# Patient Record
Sex: Male | Born: 1985 | Race: White | Hispanic: No | Marital: Single | Smoking: Never smoker
Health system: Southern US, Community
[De-identification: ages and names within clinical notes are randomized; demographics above are authoritative.]

## PROBLEM LIST (undated history)

## (undated) DIAGNOSIS — N200 Calculus of kidney: Secondary | ICD-10-CM

## (undated) DIAGNOSIS — F329 Major depressive disorder, single episode, unspecified: Secondary | ICD-10-CM

## (undated) DIAGNOSIS — F32A Depression, unspecified: Secondary | ICD-10-CM

## (undated) HISTORY — PX: CHOLECYSTECTOMY: SHX55

---

## 2013-02-16 ENCOUNTER — Emergency Department (HOSPITAL_COMMUNITY)
Admission: EM | Admit: 2013-02-16 | Discharge: 2013-02-17 | Disposition: A | Discharge status: Home / Self Care | Payer: BLUE CROSS/BLUE SHIELD | Attending: Emergency Medicine | Admitting: Emergency Medicine

## 2013-02-16 ENCOUNTER — Encounter (HOSPITAL_COMMUNITY): Payer: Self-pay | Admitting: Emergency Medicine

## 2013-02-16 ENCOUNTER — Emergency Department (HOSPITAL_COMMUNITY): Payer: BLUE CROSS/BLUE SHIELD

## 2013-02-16 DIAGNOSIS — X500XXA Overexertion from strenuous movement or load, initial encounter: Secondary | ICD-10-CM | POA: Diagnosis not present

## 2013-02-16 DIAGNOSIS — S99929A Unspecified injury of unspecified foot, initial encounter: Secondary | ICD-10-CM | POA: Diagnosis not present

## 2013-02-16 DIAGNOSIS — Z87442 Personal history of urinary calculi: Secondary | ICD-10-CM | POA: Diagnosis not present

## 2013-02-16 DIAGNOSIS — S99919A Unspecified injury of unspecified ankle, initial encounter: Principal | ICD-10-CM

## 2013-02-16 DIAGNOSIS — Y9289 Other specified places as the place of occurrence of the external cause: Secondary | ICD-10-CM | POA: Insufficient documentation

## 2013-02-16 DIAGNOSIS — S6992XA Unspecified injury of left wrist, hand and finger(s), initial encounter: Secondary | ICD-10-CM

## 2013-02-16 DIAGNOSIS — Z8659 Personal history of other mental and behavioral disorders: Secondary | ICD-10-CM | POA: Diagnosis not present

## 2013-02-16 DIAGNOSIS — Y990 Civilian activity done for income or pay: Secondary | ICD-10-CM | POA: Diagnosis not present

## 2013-02-16 DIAGNOSIS — S8990XA Unspecified injury of unspecified lower leg, initial encounter: Secondary | ICD-10-CM | POA: Diagnosis present

## 2013-02-16 DIAGNOSIS — Y9389 Activity, other specified: Secondary | ICD-10-CM | POA: Diagnosis not present

## 2013-02-16 DIAGNOSIS — X503XXA Overexertion from repetitive movements, initial encounter: Secondary | ICD-10-CM | POA: Diagnosis not present

## 2013-02-16 HISTORY — DX: Major depressive disorder, single episode, unspecified: F32.9

## 2013-02-16 HISTORY — DX: Calculus of kidney: N20.0

## 2013-02-16 HISTORY — DX: Depression, unspecified: F32.A

## 2013-02-16 NOTE — ED Notes (Signed)
Pt. 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

## 2013-02-17 MED ORDER — IBUPROFEN 800 MG PO TABS: 800.0000 mg | ORAL_TABLET | Freq: Three times a day (TID) | ORAL | Status: AC

## 2013-02-17 NOTE — ED Provider Notes (Signed)
Medical screening examination/treatment/procedure(s) were performed by non-physician practitioner and as supervising physician I was immediately available for consultation/collaboration.    Mishel Sans, MD 02/17/13 0740 

## 2013-02-17 NOTE — Discharge Instructions (Signed)
Take ibuprofen as needed for pain. Apply ice, rest, and elevate your affected wrist. Move the wrist as tolerated. Refer to attached documents for more information.

## 2013-02-17 NOTE — ED Provider Notes (Signed)
CSN: [id number]     Arrival date & time 02/16/13  2307 History   First MD Initiated Contact with Patient 02/16/13 2316     Chief Complaint  Patient presents with  . Wrist Pain     (Consider location/radiation/quality/duration/timing/severity/associated sxs/prior Treatment) Patient is a 28 y.o. male presenting with wrist pain. The history is provided by the patient. No language interpreter was used.  Wrist Pain This is a new problem. The current episode started today. The problem occurs rarely. The problem has been unchanged. Associated symptoms include arthralgias. Pertinent negatives include no abdominal pain, chest pain, chills, fatigue, fever, nausea, neck pain, vomiting or weakness. Exacerbated by: movement. He has tried ice for the symptoms. The treatment provided mild relief.    Past Medical History  Diagnosis Date  . Depression   . Kidney calculi    Past Surgical History  Procedure Laterality Date  . Cholecystectomy     History reviewed. No pertinent family history. History  Substance Use Topics  . Smoking status: Never Smoker   . Smokeless tobacco: Not on file  . Alcohol Use: No    Review of Systems  Constitutional: Negative for fever, chills and fatigue.  HENT: Negative for trouble swallowing.   Eyes: Negative for visual disturbance.  Respiratory: Negative for shortness of breath.   Cardiovascular: Negative for chest pain and palpitations.  Gastrointestinal: Negative for nausea, vomiting, abdominal pain and diarrhea.  Genitourinary: Negative for dysuria and difficulty urinating.  Musculoskeletal: Positive for arthralgias. Negative for neck pain.  Skin: Negative for color change.  Neurological: Negative for dizziness and weakness.  Psychiatric/Behavioral: Negative for dysphoric mood.      Allergies  Review of patient's allergies indicates not on file.  Home Medications  No current outpatient prescriptions on file. BP 152/92  Pulse 104  Temp(Src) 98.6 F  (37 C) (Oral)  Resp 18  Ht 5\' 9"  (1.753 m)  Wt 187 lb (84.823 kg)  BMI 27.60 kg/m2  SpO2 99% Physical Exam  Nursing note and vitals reviewed. Constitutional: He is oriented to person, place, and time. He appears well-developed and well-nourished. No distress.  HENT:  Head: Normocephalic and atraumatic.  Eyes: Conjunctivae are normal.  Neck: Normal range of motion.  Cardiovascular: Normal rate and regular rhythm.  Exam reveals no gallop and no friction rub.   No murmur heard. Pulmonary/Chest: Effort normal and breath sounds normal. He has no wheezes. He has no rales. He exhibits no tenderness.  Musculoskeletal:  Left wrist ROM limited due to pain. No obvious deformity. No edema. Full ROM of fingers of left hand.   Neurological: He is alert and oriented to person, place, and time. Coordination normal.  Speech is goal-oriented. Moves limbs without ataxia.   Skin: Skin is warm and dry.  Psychiatric: He has a normal mood and affect. His behavior is normal.    ED Course  Procedures (including critical care time) Labs Review Labs Reviewed - No data to display Imaging Review Dg Wrist Complete Left  02/16/2013   CLINICAL DATA:  Left wrist pain  EXAM: LEFT WRIST - COMPLETE 3+ VIEW  COMPARISON:  None.  FINDINGS: There is no evidence of fracture or dislocation. There is no evidence of arthropathy or other focal bone abnormality. Soft tissues are unremarkable.  IMPRESSION: Negative.   Electronically Signed   By: Sherian Rein M.D.   On: 02/16/2013 23:47    EKG Interpretation   None       MDM   Final diagnoses:  None  1. Left wrist injury   12:24 AM Patient's xray unremarkable. No neurovascular compromise. Patient denies any other injury. Patient advised to apply ice and move as tolerated. Patient will have ibuprofen for pain.     Emilia BeckKaitlyn Norene Oliveri, [place] JerseyPA-C 02/17/13 [phone]

## 2015-05-05 IMAGING — CR DG WRIST COMPLETE 3+V*L*
4 series · 4 of 4 positions shown · non-contrast
Comparison: None.

CLINICAL DATA: Left wrist pain

EXAM:
LEFT WRIST - COMPLETE 3+ VIEW

[x wrist pa left]
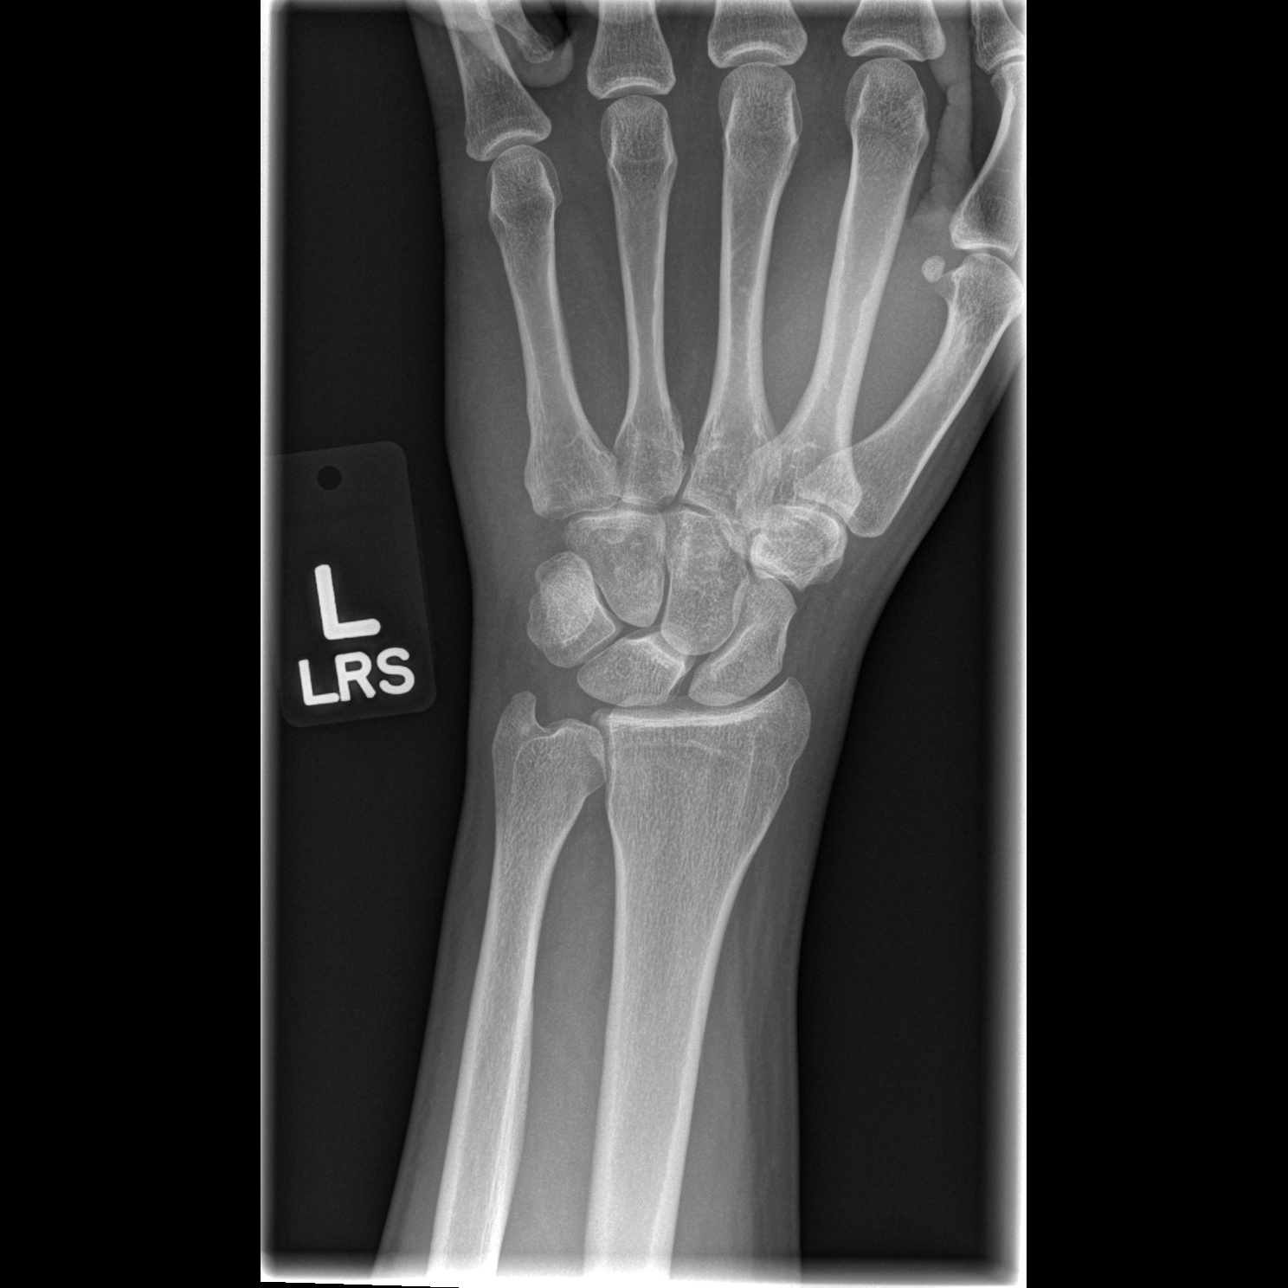

[x wrist obl left]
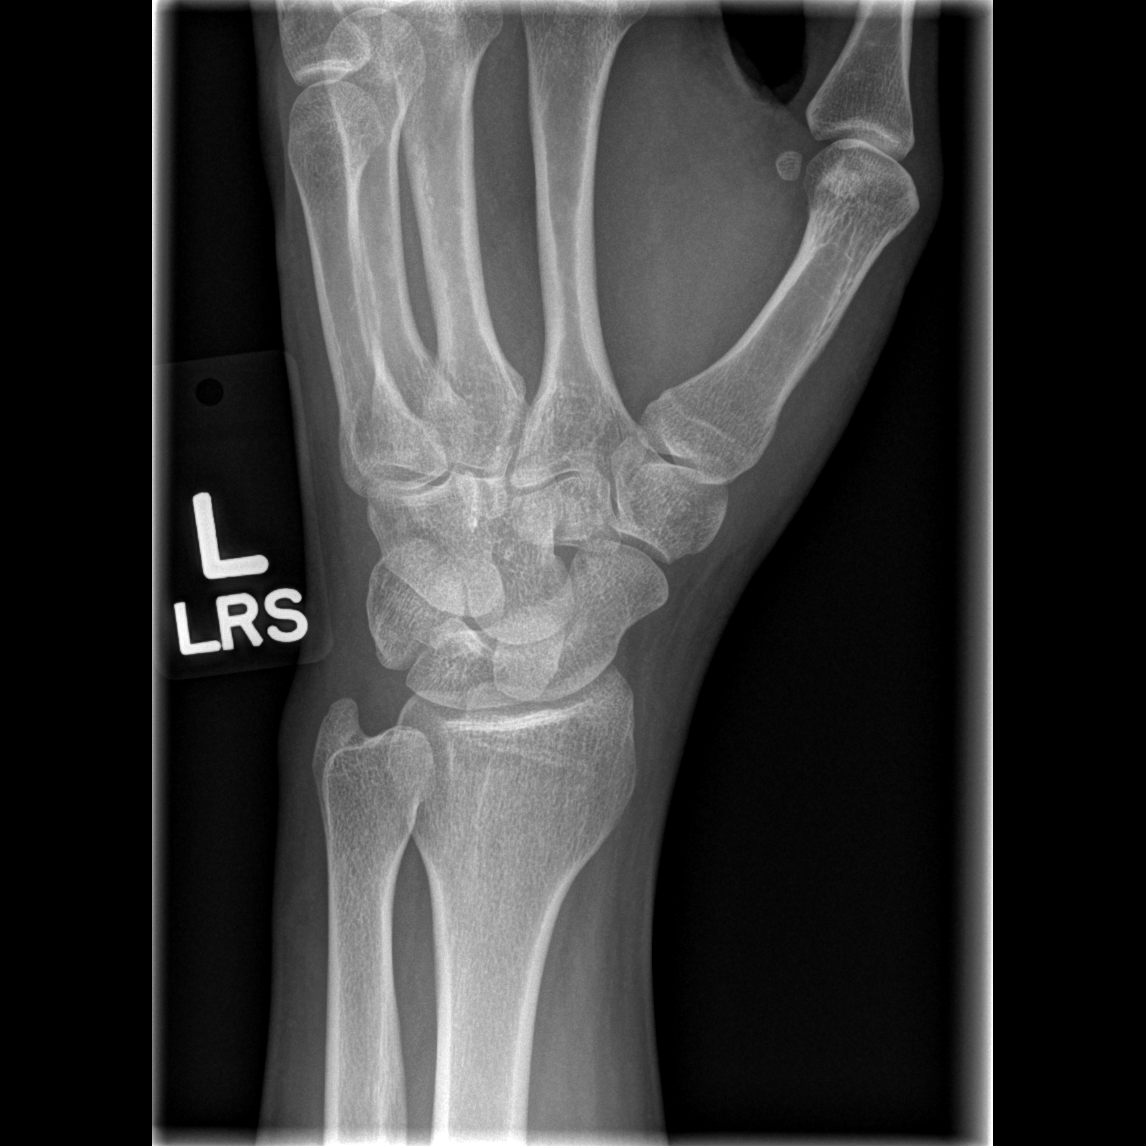

[x wrist lat left]
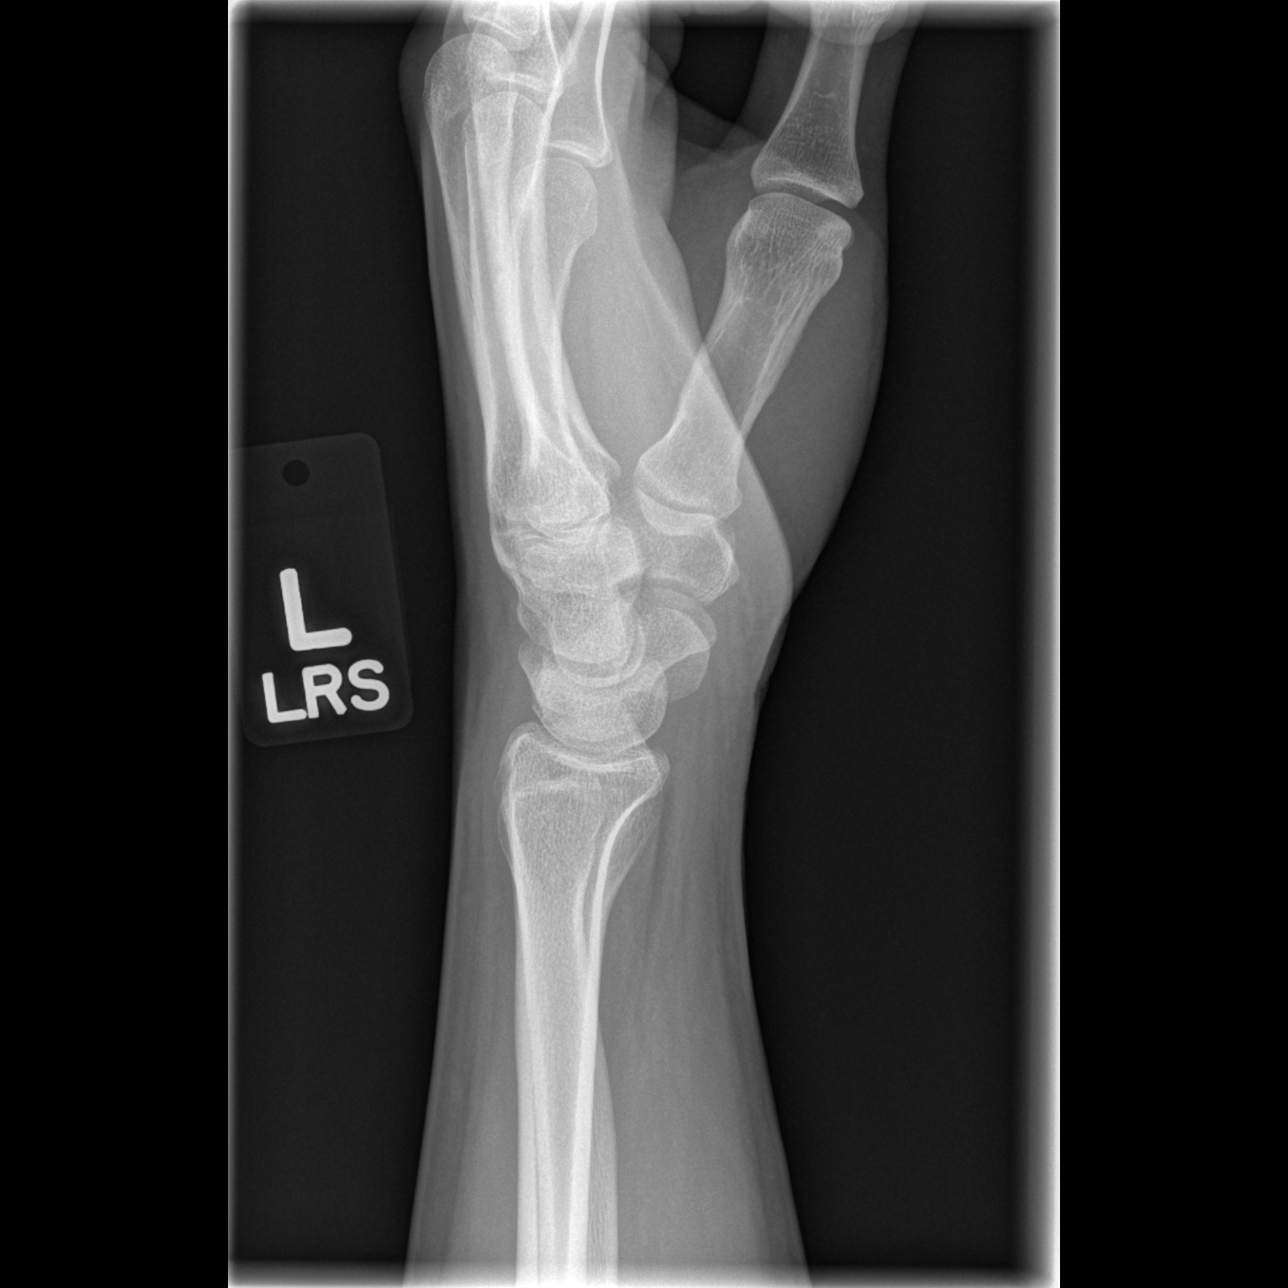

[x navicular]
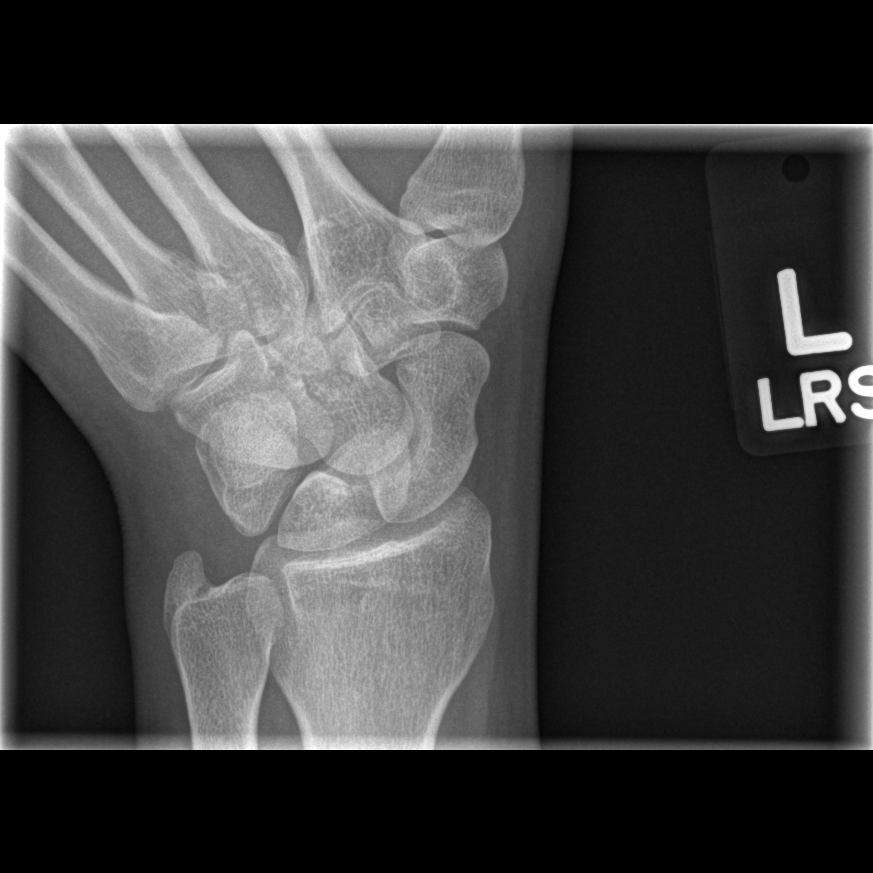

[4 of 4 positions shown; findings below may reference images not displayed]

FINDINGS: There is no evidence of fracture or dislocation. There is no
evidence of arthropathy or other focal bone abnormality. Soft
tissues are unremarkable.
IMPRESSION: Negative.

## 2021-07-10 NOTE — Progress Notes (Signed)
Sleep Medicine   Office Visit  Patient Name: Alex Bradford DOB: 1985-09-03 MRN [id number]    Chief Complaint: Sleep consult  Brief History:  Almin presents for an initial consult for sleep evaluation and to establish care. Patient reports his sleep quality is poor due to waking tired daily. He reports this has been going on for 17 years or so. This is noted most nights. The patient's family reports  loud snoring at night. The patient relates the following symptoms: waking tired, sometimes wakes with a headache, brain fog and lack of focus, daytime sleepiness are also present. The patient goes to sleep at 12:00am and wakes up at 6:00am.  Patient delay sleep onset frequently and can go to bed as late as 5:00am . Patient reports taking daily naps that are 1-3 hours long. Sleep quality is the same  when outside home environment.  Patient has noted some restlessness of his legs, but it does not disrupt his sleep.  The patient  relates no unusual behavior during the night.  The patient relates anxiety and depression as a history of psychiatric problems. The Epworth Sleepiness Score is 5 out of 24 .  The patient relates  Cardiovascular risk factors include: His primary care is watching his blood pressure, it tends to run high under stressful situations.  The patient reports that he just recently obtained a new bed which has helped his sleep. Patient admits his sleep hygiene is "off". Dad has OSA.   ROS  General: (-) fever, (-) chills, (-) night sweat Nose and Sinuses: (-) nasal stuffiness or itchiness, (-) postnasal drip, (-) nosebleeds, (-) sinus trouble. Mouth and Throat: (-) sore throat, (-) hoarseness. Neck: (-) swollen glands, (-) enlarged thyroid, (-) neck pain. Respiratory: - cough, - shortness of breath, - wheezing. Neurologic: - numbness, - tingling. Psychiatric: + anxiety, - depression Sleep behavior: -sleep paralysis -hypnogogic hallucinations -dream enactment      -vivid dreams -cataplexy  -night terrors -sleep walking   Current Medication: Outpatient Encounter Medications as of 07/12/2021  Medication Sig   albuterol (VENTOLIN HFA) 108 (90 Base) MCG/ACT inhaler PLEASE SEE ATTACHED FOR DETAILED DIRECTIONS   desloratadine (CLARINEX) 5 MG tablet Take 1 tablet by mouth daily.   fluticasone (FLONASE) 50 MCG/ACT nasal spray    montelukast (SINGULAIR) 10 MG tablet TAKE 1 TABLET BY MOUTH EVERY DAY AT NIGHT   fluticasone-salmeterol (ADVAIR HFA) 115-21 MCG/ACT inhaler Inhale 2 puffs into the lungs 2 (two) times daily.   ibuprofen (ADVIL,MOTRIN) 800 MG tablet Take 1 tablet (800 mg total) by mouth 3 (three) times daily.   No facility-administered encounter medications on file as of 07/12/2021.    Surgical History: Past Surgical History:  Procedure Laterality Date   CHOLECYSTECTOMY      Medical History: Past Medical History:  Diagnosis Date   Depression    Kidney calculi     Family History: Non contributory to the present illness  Social History: Social History   Socioeconomic History   Marital status: Single    Spouse name: Not on file   Number of children: Not on file   Years of education: Not on file   Highest education level: Not on file  Occupational History   Not on file  Tobacco Use   Smoking status: Never   Smokeless tobacco: Not on file  Substance and Sexual Activity   Alcohol use: No   Drug use: No   Sexual activity: Not on file  Other Topics Concern   Not on file  Social History Narrative   Not on file   Social Determinants of Health   Financial Resource Strain: Not on file  Food Insecurity: Not on file  Transportation Needs: Not on file  Physical Activity: Not on file  Stress: Not on file  Social Connections: Not on file  Intimate Partner Violence: Not on file    Vital Signs: Blood pressure (!) 145/103, pulse 84, height 5' 9.5" (1.765 m), weight 205 lb 3.2 oz (93.1 kg), SpO2 99 %. Body mass index is 29.87 kg/m.   Examination: General  Appearance: The patient is well-developed, well-nourished, and in no distress. Neck Circumference: 46cm Skin: Gross inspection of skin unremarkable. Head: normocephalic, no gross deformities. Eyes: no gross deformities noted. ENT: ears appear grossly normal Neurologic: Alert and oriented. No involuntary movements.    EPWORTH SLEEPINESS SCALE:  Scale:  (0)= no chance of dozing; (1)= slight chance of dozing; (2)= moderate chance of dozing; (3)= high chance of dozing  Chance  Situtation    Sitting and reading: 1    Watching TV: 1    Sitting Inactive in public: 0    As a passenger in car: 0      Lying down to rest: 2    Sitting and talking: 0    Sitting quielty after lunch: 1    In a car, stopped in traffic: 0   TOTAL SCORE:   5 out of 24    SLEEP STUDIES:  none   LABS: No results found for this or any previous visit (from the past 2160 hour(s)).  Radiology: DG Wrist Complete Left  Result Date: 02/16/2013 CLINICAL DATA:  Left wrist pain EXAM: LEFT WRIST - COMPLETE 3+ VIEW COMPARISON:  None. FINDINGS: There is no evidence of fracture or dislocation. There is no evidence of arthropathy or other focal bone abnormality. Soft tissues are unremarkable. IMPRESSION: Negative. Electronically Signed   By: Sherian Rein M.D.   On: 02/16/2013 23:47    No results found.  No results found.    Assessment and Plan: Patient Active Problem List   Diagnosis Date Noted   GERD (gastroesophageal reflux disease) 07/12/2021   Mild intermittent asthma without complication 07/12/2021     PLAN OSA:   Patient evaluation suggests high risk of sleep disordered breathing due to loud snoring, waking tired, sometimes wakes with a headache, brain fog and lack of focus, daytime sleepiness, and Fhx OSA. Patient has comorbid cardiovascular risk factors including: Elevated BP in office which could be exacerbated by pathologic sleep-disordered breathing.  Suggest: HST to assess/treat the  patient's sleep disordered breathing.    1. Hypersomnia Will order HST  2. Mild intermittent asthma without complication Continue inhalers as prescribed  3. Elevated BP without diagnosis of hypertension Elevated in office, per pt his PCP is monitoring this   General Counseling: I have discussed the findings of the evaluation and examination with Casmer.  I have also discussed any further diagnostic evaluation thatmay be needed or ordered today. Robertt verbalizes understanding of the findings of todays visit. We also reviewed his medications today and discussed drug interactions and side effects including but not limited excessive drowsiness and altered mental states. We also discussed that there is always a risk not just to him but also people around him. he has been encouraged to call the office with any questions or concerns that should arise related to todays visit.  No orders of the defined types were placed in this encounter.       I  have personally obtained a history, evaluated the patient, evaluated pertinent data, formulated the assessment and plan and placed orders.  This patient was seen by Lynn Ito, PA-C in collaboration with Dr. Freda Munro as a part of collaborative care agreement.    Yevonne Pax, MD [facility] Diplomate ABMS Pulmonary and Critical Care Medicine Sleep medicine

## 2021-07-12 ENCOUNTER — Ambulatory Visit (INDEPENDENT_AMBULATORY_CARE_PROVIDER_SITE_OTHER): Payer: BLUE CROSS/BLUE SHIELD | Admitting: Internal Medicine

## 2021-07-12 VITALS — BP 145/103 | HR 84 | Ht 69.5 in | Wt 205.2 lb

## 2021-07-12 DIAGNOSIS — G4710 Hypersomnia, unspecified: Secondary | ICD-10-CM | POA: Diagnosis not present

## 2021-07-12 DIAGNOSIS — J4520 Mild intermittent asthma, uncomplicated: Secondary | ICD-10-CM | POA: Diagnosis not present

## 2021-07-12 DIAGNOSIS — R030 Elevated blood-pressure reading, without diagnosis of hypertension: Secondary | ICD-10-CM | POA: Diagnosis not present

## 2021-07-12 DIAGNOSIS — K219 Gastro-esophageal reflux disease without esophagitis: Secondary | ICD-10-CM | POA: Insufficient documentation

## 2021-07-12 NOTE — Patient Instructions (Signed)
Hypersomnia Hypersomnia is a condition in which a person feels very tired during the day even though the person gets plenty of sleep at night. A person with this condition may take naps during the day and may find it very difficult to wake up from sleep. Hypersomnia may affect a person's ability to think, concentrate, drive, or remember things. What are the causes? The cause of this condition may not be known. Possible causes include: Taking certain medicines. Using drugs or alcohol. Sleep disorders, such as narcolepsy and sleep apnea. Injury to the head, brain, or spinal cord. Tumors. Certain medical conditions. These include: Depression. Diabetes. Gastroesophageal reflux disease (GERD). An underactive thyroid gland (hypothyroidism). What are the signs or symptoms? The main symptoms of hypersomnia include: Feeling very tired throughout the day, regardless of how much sleep you got the night before. Having trouble waking up. Others may find it difficult to wake you up when you are sleeping. Sleeping for longer and longer periods at a time. Taking naps throughout the day. Other symptoms may include: Feeling restless, anxious, or annoyed. Lacking energy. Having trouble with: Remembering. Speaking. Thinking. Loss of appetite. Seeing, hearing, tasting, smelling, or feeling things that are not real (hallucinations). How is this diagnosed? This condition may be diagnosed based on: Your symptoms and medical history. Your sleeping habits. Your health care provider may ask you to write down your sleeping habits in a daily sleep log, along with any symptoms you have. A series of tests that are done while you sleep (sleep study or polysomnogram). A test that measures how quickly you can fall asleep during the day (daytime nap study or multiple sleep latency test). How is this treated? This condition may be treated by: Following a regular sleep routine. Making lifestyle changes, such as  changing your eating habits, getting regular exercise, and avoiding alcohol or caffeinated beverages. Taking medicines to make you more alert (stimulants) during the day. Treating any underlying medical causes of hypersomnia. Follow these instructions at home: Sleep habits Stick to a routine that includes going to bed and waking up at the same times every day and night. Practice a relaxing bedtime routine. This may include reading, meditation, deep breathing, or taking a warm bath before going to sleep. Exercise regularly as told by your health care provider. However, avoid exercising in the hours right before bedtime. Keep your sleep environment at a cooler temperature, darkened, and quiet. Sleep with pillows and a mattress that are comfortable and supportive. Schedule short 20-minute naps for when you feel sleepiest during the day. Talk with your employer or teachers about your hypersomnia. If possible, adjust your schedule so that: You have a regular daytime work schedule. You can take a scheduled nap during the day. You do not have to work or be active at night. Do not eat a heavy meal for a few hours before bedtime. Eat your meals at about the same times every day. Safety  Do not drive or use machinery if you are sleepy. Ask your health care provider if it is safe for you to drive. Wear a life jacket when swimming or spending time near water. General instructions  Take over-the-counter and prescription medicines only as told by your health care provider. This includes supplements. Avoid drinking alcohol or caffeinated beverages. Keep a sleep log that will help your health care provider manage your condition. This may include information about: What time you go to bed each night. How often you wake up at night. How many hours   you sleep at night. How often and for how long you nap during the day. Any observations from others, such as leg movements during sleep, sleep walking, or  snoring. Keep all follow-up visits. This is important. Contact a health care provider if: You have new symptoms. Your symptoms get worse. Get help right away if: You have thoughts about hurting yourself or someone else. Get help right away if you feel like you may hurt yourself or others, or have thoughts about taking your own life. Go to your nearest emergency room or: Call 911. Call the National Suicide Prevention Lifeline at 1-800-273-8255 or 988. This is open 24 hours a day. Text the Crisis Text Line at 741741. Summary Hypersomnia refers to a condition in which you feel very tired during the day even though you get plenty of sleep at night. A person with this condition may take naps during the day and may find it very difficult to wake up from sleep. Hypersomnia may affect a person's ability to think, concentrate, drive, or remember things. Treatment may include a regular sleep routine and making some lifestyle changes. This information is not intended to replace advice given to you by your health care provider. Make sure you discuss any questions you have with your health care provider. Document Revised: 12/05/2020 Document Reviewed: 12/05/2020 Elsevier Patient Education  2023 Elsevier Inc.  

## 2021-07-21 ENCOUNTER — Encounter (INDEPENDENT_AMBULATORY_CARE_PROVIDER_SITE_OTHER): Payer: BLUE CROSS/BLUE SHIELD | Admitting: Internal Medicine

## 2021-07-21 DIAGNOSIS — G4719 Other hypersomnia: Secondary | ICD-10-CM | POA: Diagnosis not present

## 2021-08-01 NOTE — Procedures (Signed)
[facility]  Portable Polysomnogram Report Part 1 Phone: [phone] Fax: [phone]  Patient Name: Alex Bradford, Alex Bradford Recording Device: Tawana Scale  D.O.B.: 1985-09-05 Acquisition Number: [id number]  Referring Physician: Deeann Saint  Acquisition Date: 07/21/2021   History: The patient is a 36 years old male who was referred for evaluation of possible sleep apnea.  Medical History: depression.  Medications: Ventolin, Clarenex, Flonase, Singulair, Advair.  PROCEDURE  The unattended portable polysomnogram was conducted on the night of 07/21/2021.  The following parameters were monitored: Nasal and oral airflow, and body position. Additionally, thoracic and abdominal movements were recorded by inductance plethysmography. Oxygen saturation (SpO2) and heart rate (ECG) was monitored using a pulse Oximeter.  The tracing was scored using 30 second epochs. Hypopneas were scored per AASM definition VIIID1.B (4% desaturation).    Description: The total recording time was 802.3 minutes. Sleep parameters are not recorded.  Respiratory monitoring demonstrated significant snoring across the night in all positions. Most of the recording was in the supine position. There were a total of 976 apneas and hypopneas for a Respiratory Event Index of 75.3 apneas and hypopneas per hour of recording. The average duration of the respiratory events was 19.2 seconds with a maximum duration of 57.0 seconds. The respiratory events were associated with peripheral oxygen desaturations on the average to 95 %. The lowest oxygen desaturation associated with a respiratory event was 79 %. Additionally, the mean oxygen saturation was 95 %. The total duration of oxygen < 90% was 49.9 minutes and <80% was 0.0 minutes.   Cardiac monitoring- The average heart rate during the recording was 78.7 bpm.  Impression: This routine overnight portable polysomnogram demonstrated the presence of obstructive sleep  apnea. Overall the Respiratory Event Index was 75.3 apneas and hypopneas per hour of recording with the lowest desaturation to 79 %. Most of the recording was in the supine position.  Recommendations:     An emergent CPAP titration would be recommended due to the severity of the sleep apnea. Some supine sleep should be ensured to optimize the titration. Would recommend weight loss in a patient with a BMI of 29.8 lb/in2.   Yevonne Pax, MD [facility] Diplomate ABMS Pulmonary Critical Care Medicine Sleep Medicine Electronically reviewed and digitally signed      [facility]  Portable Polysomnogram Report Part 2 Phone: [phone] Fax: [phone]    Study Date: 07/21/2021  Patient Name: [name], [name] Recording Device: Tawana Scale  Sex: M Height: 69.5 in.  D.O.B.: [birth year] Weight: 205.0 lbs.  Age: 37 years B.M.I: 29.8 lb/in2   Times and Durations  Lights off clock time:  7:41:17 PM Total Recording Time (TRT): 802.3 minutes  Lights on clock time: 9:03:35 AM Time In Bed (TIB): 802.3 minutes   Summary  AHI 75.3 OAI 70.5 CAI 0.0 Lowest Desat 79  AHI is the number of apneas and hypopneas per hour. OAI is the number of obstructive apneas per hour. CAI is the number of central apneas per hour. Lowest Desat is the lowest blood oxygen level that lasted at least 2 seconds.  RESPIRATORY EVENTS   Index (#/hour) Total # of Events Mean duration  (sec) Max duration  (sec) # of Events by Position       Supine Prone Left Right Up  Central Apneas 0.0 0 0.0 0.0 0 0 0 0 0  Obstructive Apneas 70.5 914 18.8 57.0 912 0 0 2 0  Mixed Apneas 0.9 12 15.5 23.5 11  0 0 1 0  Hypopneas 3.9 50 26.0 55.5 45 0 0 4 1  Apneas + Hypopneas 75.3 976 19.2 57.0 968 0 0 7 1  Total 75.3 976 19.2 57.0 968 0 0 7 1  Time in Position 743.6 0.1 0.1 34.0 3.3  AHI in Position 78.1 0.0 0.0 12.4 150.0    Oximetry Summary   Dur. (min) % TIB  <90 % 49.9 6.2  <85 % 6.2 0.8  <80 % 0.0 0.0  <70 % 0.0 0.0  Total  Dur (min) < 89 34.4 min  Average (%) 95  Total # of Desats 957  Desat Index (#/hour) 74.3  Desat Max (%) 18  Desat Max dur (sec) 53.0  Lowest SpO2 % during sleep 79  Duration of Min SpO2 (sec) 5    Heart Rate Stats  Mean HR during sleep (BPM)  Highest HR during sleep 108  (BPM)  Highest HR during TIB  108 (BPM)    Snoring Summary  Total Snoring Episodes 1295  Total Duration with Snoring 241.2 minutes  Mean Duration of Snoring 11.2 seconds  Percentage of Snoring 31.0 %

## 2022-05-15 NOTE — Progress Notes (Signed)
[facility] [address] [place], [place] [address]  Pulmonary Sleep Medicine   Office Visit Note  Patient Name: Alex Bradford DOB: 08/23/1985 MRN [id number]    Chief Complaint: Obstructive Sleep Apnea visit  Brief History:  Sebastiano is seen today for a follow up visit for APAP@ 5-20 cmH2O. The patient has a 10 month history of sleep apnea. Patient is using PAP nightly.  The patient feels rested after sleeping with PAP.  The patient reports  benefiting from PAP use. Reported sleepiness is  improved and the Epworth Sleepiness Score is 2 out of 24. The patient will occasionally take naps. The patient complains of the following: none.  The compliance download shows 77% compliance with an average use time of 5 hours 53 minutes. The AHI is 2.3.  The patient does not complain of limb movements disrupting sleep. The patient continues to require PAP therapy in order to eliminate sleep apnea.   ROS  General: (-) fever, (-) chills, (-) night sweat Nose and Sinuses: (-) nasal stuffiness or itchiness, (-) postnasal drip, (-) nosebleeds, (-) sinus trouble. Mouth and Throat: (-) sore throat, (-) hoarseness. Neck: (-) swollen glands, (-) enlarged thyroid, (-) neck pain. Respiratory: - cough, - shortness of breath, - wheezing. Neurologic: - numbness, - tingling. Psychiatric: - anxiety, - depression   Current Medication: Outpatient Encounter Medications as of 05/16/2022  Medication Sig   albuterol (VENTOLIN HFA) 108 (90 Base) MCG/ACT inhaler PLEASE SEE ATTACHED FOR DETAILED DIRECTIONS   desloratadine (CLARINEX) 5 MG tablet Take 1 tablet by mouth daily.   fluticasone (FLONASE) 50 MCG/ACT nasal spray    fluticasone-salmeterol (ADVAIR HFA) 115-21 MCG/ACT inhaler Inhale 2 puffs into the lungs 2 (two) times daily.   ibuprofen (ADVIL,MOTRIN) 800 MG tablet Take 1 tablet (800 mg total) by mouth 3 (three) times daily.   montelukast (SINGULAIR) 10 MG tablet TAKE 1 TABLET BY MOUTH EVERY DAY AT NIGHT    Risankizumab-rzaa (SKYRIZI) 150 MG/ML SOSY    No facility-administered encounter medications on file as of 05/16/2022.    Surgical History: Past Surgical History:  Procedure Laterality Date   CHOLECYSTECTOMY      Medical History: Past Medical History:  Diagnosis Date   Depression    Kidney calculi     Family History: Non contributory to the present illness  Social History: Social History   Socioeconomic History   Marital status: Single    Spouse name: Not on file   Number of children: Not on file   Years of education: Not on file   Highest education level: Not on file  Occupational History   Not on file  Tobacco Use   Smoking status: Never   Smokeless tobacco: Not on file  Substance and Sexual Activity   Alcohol use: No   Drug use: No   Sexual activity: Not on file  Other Topics Concern   Not on file  Social History Narrative   Not on file   Social Determinants of Health   Financial Resource Strain: Not on file  Food Insecurity: Not on file  Transportation Needs: Not on file  Physical Activity: Not on file  Stress: Not on file  Social Connections: Not on file  Intimate Partner Violence: Not on file    Vital Signs: Blood pressure (!) 156/111, pulse 82, resp. rate 18, height 5' 9.5" (1.765 m), weight 215 lb (97.5 kg), SpO2 97 %. Body mass index is 31.29 kg/m.    Examination: General Appearance: The patient is well-developed, well-nourished,  and in no distress. Neck Circumference: 46 cm Skin: Gross inspection of skin unremarkable. Head: normocephalic, no gross deformities. Eyes: no gross deformities noted. ENT: ears appear grossly normal Neurologic: Alert and oriented. No involuntary movements.  STOP BANG RISK ASSESSMENT S (snore) Have you been told that you snore?     YES   T (tired) Are you often tired, fatigued, or sleepy during the day?   YES  O (obstruction) Do you stop breathing, choke, or gasp during sleep? YES   P (pressure) Do you  have or are you being treated for high blood pressure? NO   B (BMI) Is your body index greater than 35 kg/m? NO   A (age) Are you 37 years old or older? NO   N (neck) Do you have a neck circumference greater than 16 inches?   NO   G (gender) Are you a male? YES   TOTAL STOP/BANG "YES" ANSWERS 4       A STOP-Bang score of 2 or less is considered low risk, and a score of 5 or more is high risk for having either moderate or severe OSA. For people who score 3 or 4, doctors may need to perform further assessment to determine how likely they are to have OSA.         EPWORTH SLEEPINESS SCALE:  Scale:  (0)= no chance of dozing; (1)= slight chance of dozing; (2)= moderate chance of dozing; (3)= high chance of dozing  Chance  Situtation    Sitting and reading: 0    Watching TV: 0    Sitting Inactive in public: 0    As a passenger in car: 1      Lying down to rest: 1    Sitting and talking: 0    Sitting quielty after lunch: 0    In a car, stopped in traffic: 0   TOTAL SCORE:   2 out of 24    SLEEP STUDIES:  HST (07/2021) AHI 75.3/hr, min SpO2 79%   CPAP COMPLIANCE DATA:  Date Range: 03/16/2022-05/14/2022  Average Daily Use: 5 hours 53 minutes  Median Use: 5 hours 39 minutes  Compliance for > 4 Hours: 77%  AHI: 2.3 respiratory events per hour  Days Used: 60/60 days  Mask Leak: 17.3  95th Percentile Pressure: 10.6         LABS: No results found for this or any previous visit (from the past 2160 hour(s)).  Radiology: DG Wrist Complete Left  Result Date: 02/16/2013 CLINICAL DATA:  Left wrist pain EXAM: LEFT WRIST - COMPLETE 3+ VIEW COMPARISON:  None. FINDINGS: There is no evidence of fracture or dislocation. There is no evidence of arthropathy or other focal bone abnormality. Soft tissues are unremarkable. IMPRESSION: Negative. Electronically Signed   By: Sherian Rein M.D.   On: 02/16/2013 23:47    No results found.  No results  found.    Assessment and Plan: Patient Active Problem List   Diagnosis Date Noted   GERD (gastroesophageal reflux disease) 07/12/2021   Mild intermittent asthma without complication 07/12/2021      The patient does tolerate PAP and reports benefit from PAP use. The patient was reminded how to adjust mask fit and advised to change supplies regularly. The patient was also counselled on nightly use. The compliance is fair. The AHI is 2.3. Patient continues to require PAP to treat their apnea and is medically necessary.  1. OSA (obstructive sleep apnea) Continue nightly use  2. CPAP use counseling  CPAP couseling-Discussed importance of adequate CPAP use as well as proper care and cleaning techniques of machine and all supplies.  3. Mild intermittent asthma without complication Continue inhaler as prescribed  4. Elevated BP without diagnosis of hypertension Elevated in office, will monitor and follow up with PCP  5. Obesity (BMI 30.0-34.9) Obesity Counseling: Had a lengthy discussion regarding patients BMI and weight issues. Patient was instructed on portion control as well as increased activity. Also discussed caloric restrictions with trying to maintain intake less than 2000 Kcal. Discussions were made in accordance with the 5As of weight management. Simple actions such as not eating late and if able to, taking a walk is suggested.    General Counseling: I have discussed the findings of the evaluation and examination with Darlene.  I have also discussed any further diagnostic evaluation thatmay be needed or ordered today. Graves verbalizes understanding of the findings of todays visit. We also reviewed his medications today and discussed drug interactions and side effects including but not limited excessive drowsiness and altered mental states. We also discussed that there is always a risk not just to him but also people around him. he has been encouraged to call the office with any questions  or concerns that should arise related to todays visit.  No orders of the defined types were placed in this encounter.       I have personally obtained a history, examined the patient, evaluated laboratory and imaging results, formulated the assessment and plan and placed orders.  This patient was seen by Lynn Ito, PA-C in collaboration with Dr. Freda Munro as a part of collaborative care agreement.  Yevonne Pax, MD [facility] Diplomate ABMS Pulmonary Critical Care Medicine and Sleep Medicine

## 2022-05-16 ENCOUNTER — Ambulatory Visit (INDEPENDENT_AMBULATORY_CARE_PROVIDER_SITE_OTHER): Payer: BLUE CROSS/BLUE SHIELD | Admitting: Internal Medicine

## 2022-05-16 VITALS — BP 156/111 | HR 82 | Resp 18 | Ht 69.5 in | Wt 215.0 lb

## 2022-05-16 DIAGNOSIS — E669 Obesity, unspecified: Secondary | ICD-10-CM

## 2022-05-16 DIAGNOSIS — Z7189 Other specified counseling: Secondary | ICD-10-CM

## 2022-05-16 DIAGNOSIS — G4733 Obstructive sleep apnea (adult) (pediatric): Secondary | ICD-10-CM

## 2022-05-16 DIAGNOSIS — J4520 Mild intermittent asthma, uncomplicated: Secondary | ICD-10-CM | POA: Diagnosis not present

## 2022-05-16 DIAGNOSIS — R030 Elevated blood-pressure reading, without diagnosis of hypertension: Secondary | ICD-10-CM | POA: Diagnosis not present

## 2022-05-16 NOTE — Patient Instructions (Signed)

## 2022-07-04 ENCOUNTER — Telehealth: Payer: Self-pay | Admitting: Internal Medicine

## 2022-07-04 NOTE — Telephone Encounter (Signed)
Faxed; 07-12-21 office note to [employer] [phone]

## 2022-09-18 NOTE — Progress Notes (Signed)
[facility] [address] [place], [place] [address]  Pulmonary Sleep Medicine   Office Visit Note  Patient Name: Alex Bradford DOB: Jun 30, 1985 MRN [id number]    Chief Complaint: Obstructive Sleep Apnea visit  Brief History:  Alex Bradford is seen today for a follow up visit for APAP@ 5-20 cmH2O. The patient has a 1 year history of sleep apnea. Patient is using PAP nightly.  The patient feels rested after sleeping with PAP.  The patient reports benefiting from PAP use. Reported sleepiness is  improved and the Epworth Sleepiness Score is 2 out of 24. The patient will rarely take naps. The patient complains of the following: none.  The compliance download shows 97% compliance with an average use time of 7 hours 55 minutes. The AHI is 3.3.  The patient does not compain of limb movements disrupting sleep. The patient continues to require PAP therapy in order to eliminate sleep apnea.   ROS  General: (-) fever, (-) chills, (-) night sweat Nose and Sinuses: (-) nasal stuffiness or itchiness, (-) postnasal drip, (-) nosebleeds, (-) sinus trouble. Mouth and Throat: (-) sore throat, (-) hoarseness. Neck: (-) swollen glands, (-) enlarged thyroid, (-) neck pain. Respiratory: - cough, - shortness of breath, - wheezing. Neurologic: - numbness, - tingling. Psychiatric: + anxiety, - depression   Current Medication: Outpatient Encounter Medications as of 09/19/2022  Medication Sig   albuterol (VENTOLIN HFA) 108 (90 Base) MCG/ACT inhaler PLEASE SEE ATTACHED FOR DETAILED DIRECTIONS   desloratadine (CLARINEX) 5 MG tablet Take 1 tablet by mouth daily.   fluticasone (FLONASE) 50 MCG/ACT nasal spray    fluticasone-salmeterol (ADVAIR HFA) 115-21 MCG/ACT inhaler Inhale 2 puffs into the lungs 2 (two) times daily.   ibuprofen (ADVIL,MOTRIN) 800 MG tablet Take 1 tablet (800 mg total) by mouth 3 (three) times daily.   montelukast (SINGULAIR) 10 MG tablet TAKE 1 TABLET BY MOUTH EVERY DAY AT NIGHT    Risankizumab-rzaa (SKYRIZI) 150 MG/ML SOSY    No facility-administered encounter medications on file as of 09/19/2022.    Surgical History: Past Surgical History:  Procedure Laterality Date   CHOLECYSTECTOMY      Medical History: Past Medical History:  Diagnosis Date   Depression    Kidney calculi     Family History: Non contributory to the present illness  Social History: Social History   Socioeconomic History   Marital status: Single    Spouse name: Not on file   Number of children: Not on file   Years of education: Not on file   Highest education level: Not on file  Occupational History   Not on file  Tobacco Use   Smoking status: Never   Smokeless tobacco: Not on file  Substance and Sexual Activity   Alcohol use: No   Drug use: No   Sexual activity: Not on file  Other Topics Concern   Not on file  Social History Narrative   Not on file   Social Determinants of Health   Financial Resource Strain: Not on file  Food Insecurity: Not on file  Transportation Needs: Not on file  Physical Activity: Not on file  Stress: Not on file  Social Connections: Not on file  Intimate Partner Violence: Not on file    Vital Signs: Blood pressure (!) 161/108, pulse 99, resp. rate 16, height 5\' 10"  (1.778 m), weight 220 lb (99.8 kg), SpO2 99%. Body mass index is 31.57 kg/m.    Examination: General Appearance: The patient is well-developed, well-nourished, and in  no distress. Neck Circumference: 46 cm Skin: Gross inspection of skin unremarkable. Head: normocephalic, no gross deformities. Eyes: no gross deformities noted. ENT: ears appear grossly normal Neurologic: Alert and oriented. No involuntary movements.  STOP BANG RISK ASSESSMENT S (snore) Have you been told that you snore?     NO   T (tired) Are you often tired, fatigued, or sleepy during the day?   NO  O (obstruction) Do you stop breathing, choke, or gasp during sleep? NO   P (pressure) Do you have or  are you being treated for high blood pressure? NO   B (BMI) Is your body index greater than 35 kg/m? YES   A (age) Are you 34 years old or older? YES   N (neck) Do you have a neck circumference greater than 16 inches?   YES   G (gender) Are you a male? YES   TOTAL STOP/BANG "YES" ANSWERS 4       A STOP-Bang score of 2 or less is considered low risk, and a score of 5 or more is high risk for having either moderate or severe OSA. For people who score 3 or 4, doctors may need to perform further assessment to determine how likely they are to have OSA.         EPWORTH SLEEPINESS SCALE:  Scale:  (0)= no chance of dozing; (1)= slight chance of dozing; (2)= moderate chance of dozing; (3)= high chance of dozing  Chance  Situtation    Sitting and reading: 0    Watching TV: 0    Sitting Inactive in public: 0    As a passenger in car: 0      Lying down to rest: 1    Sitting and talking: 0    Sitting quielty after lunch: 1    In a car, stopped in traffic: 0   TOTAL SCORE:   2 out of 24    SLEEP STUDIES:  HST (07/2021) AHI 75/hr, min SpO2 79%   CPAP COMPLIANCE DATA:  Date Range: 05/20/2022-09/16/2022  Average Daily Use: 7 hours 55 minutes  Median Use: 7 hours 51 minutes  Compliance for > 4 Hours: 97%  AHI: 3.3 respiratory events per hour  Days Used: 120/120 days  Mask Leak: 31  95th Percentile Pressure: 11.2         LABS: No results found for this or any previous visit (from the past 2160 hour(s)).  Radiology: DG Wrist Complete Left  Result Date: 02/16/2013 CLINICAL DATA:  Left wrist pain EXAM: LEFT WRIST - COMPLETE 3+ VIEW COMPARISON:  None. FINDINGS: There is no evidence of fracture or dislocation. There is no evidence of arthropathy or other focal bone abnormality. Soft tissues are unremarkable. IMPRESSION: Negative. Electronically Signed   By: Sherian Rein M.D.   On: 02/16/2013 23:47    No results found.  No results found.    Assessment  and Plan: Patient Active Problem List   Diagnosis Date Noted   GERD (gastroesophageal reflux disease) 07/12/2021   Mild intermittent asthma without complication 07/12/2021      The patient does tolerate PAP and reports benefit from PAP use. The patient was reminded how to adjust mask fit and advised to change supplies regularly. The patient was also counselled on nightly use. The compliance is excellent. The AHI is 3.3. Patient continues to require PAP to treat their apnea and is medically necessary.   1. OSA (obstructive sleep apnea) Continue excellent compliance  2. CPAP use counseling CPAP  couseling-Discussed importance of adequate CPAP use as well as proper care and cleaning techniques of machine and all supplies.  3. Elevated BP without diagnosis of hypertension Consistently elevated, states his PCP has been trying to put him on BP meds but he has declined and they are monitoring closely.  4. Mild intermittent asthma without complication Continue inhalers as prescribed  5. Obesity (BMI 30.0-34.9) Obesity Counseling: Had a lengthy discussion regarding patients BMI and weight issues. Patient was instructed on portion control as well as increased activity. Also discussed caloric restrictions with trying to maintain intake less than 2000 Kcal. Discussions were made in accordance with the 5As of weight management. Simple actions such as not eating late and if able to, taking a walk is suggested.    General Counseling: I have discussed the findings of the evaluation and examination with Marko.  I have also discussed any further diagnostic evaluation thatmay be needed or ordered today. Aviel verbalizes understanding of the findings of todays visit. We also reviewed his medications today and discussed drug interactions and side effects including but not limited excessive drowsiness and altered mental states. We also discussed that there is always a risk not just to him but also people around  him. he has been encouraged to call the office with any questions or concerns that should arise related to todays visit.  No orders of the defined types were placed in this encounter.       I have personally obtained a history, examined the patient, evaluated laboratory and imaging results, formulated the assessment and plan and placed orders.  This patient was seen by Lynn Ito, PA-C in collaboration with Dr. Freda Munro as a part of collaborative care agreement.  Yevonne Pax, MD [facility] Diplomate ABMS Pulmonary Critical Care Medicine and Sleep Medicine

## 2022-09-19 ENCOUNTER — Ambulatory Visit (INDEPENDENT_AMBULATORY_CARE_PROVIDER_SITE_OTHER): Payer: BLUE CROSS/BLUE SHIELD | Admitting: Internal Medicine

## 2022-09-19 VITALS — BP 161/108 | HR 99 | Resp 16 | Ht 70.0 in | Wt 220.0 lb

## 2022-09-19 DIAGNOSIS — G4733 Obstructive sleep apnea (adult) (pediatric): Secondary | ICD-10-CM

## 2022-09-19 DIAGNOSIS — E669 Obesity, unspecified: Secondary | ICD-10-CM

## 2022-09-19 DIAGNOSIS — R030 Elevated blood-pressure reading, without diagnosis of hypertension: Secondary | ICD-10-CM | POA: Diagnosis not present

## 2022-09-19 DIAGNOSIS — Z7189 Other specified counseling: Secondary | ICD-10-CM

## 2022-09-19 DIAGNOSIS — J4520 Mild intermittent asthma, uncomplicated: Secondary | ICD-10-CM | POA: Diagnosis not present

## 2022-09-19 NOTE — Patient Instructions (Signed)

## 2023-09-10 NOTE — Progress Notes (Signed)
 [facility] [address] [place], [place] [address]  Pulmonary Sleep Medicine   Office Visit Note  Patient Name: Alex Bradford DOB: 1985/10/10 MRN [id number]    Chief Complaint: Obstructive Sleep Apnea visit  Brief History:  Alex Bradford presents for an annual follow up visit on APAP@ 5-20 cmH20. The patient has a 2 year history of sleep apnea. Patient is using PAP nightly.  The patient feels rested after sleeping with PAP.  The patient reports benefit from PAP use. Reported sleepiness is  improved and the Epworth Sleepiness Score is 2 out of 24. The patient does take occasional naps for an hour or two with CPAP. The patient complains of the following: no complaints at this time.  The compliance download shows 89% compliance with an average use time of 6 hours 58 minutes. The AHI is 3.6  The patient does not complain of limb movements disrupting sleep.  ROS  General: (-) fever, (-) chills, (-) night sweat Nose and Sinuses: (-) nasal stuffiness or itchiness, (-) postnasal drip, (-) nosebleeds, (-) sinus trouble. Mouth and Throat: (-) sore throat, (-) hoarseness. Neck: (-) swollen glands, (-) enlarged thyroid, (-) neck pain. Respiratory: - cough, - shortness of breath, - wheezing. Neurologic: - numbness, - tingling. Psychiatric: + anxiety, - depression   Current Medication: Outpatient Encounter Medications as of 09/11/2023  Medication Sig   amLODipine (NORVASC) 5 MG tablet Take 5 mg by mouth.   hydrochlorothiazide (HYDRODIURIL) 12.5 MG tablet Take 12.5 mg by mouth daily.   albuterol (VENTOLIN HFA) 108 (90 Base) MCG/ACT inhaler PLEASE SEE ATTACHED FOR DETAILED DIRECTIONS   desloratadine (CLARINEX) 5 MG tablet Take 1 tablet by mouth daily.   fluticasone (FLONASE) 50 MCG/ACT nasal spray    fluticasone-salmeterol (ADVAIR HFA) 115-21 MCG/ACT inhaler Inhale 2 puffs into the lungs 2 (two) times daily.   ibuprofen  (ADVIL ,MOTRIN ) 800 MG tablet Take 1 tablet (800 mg total) by mouth 3  (three) times daily.   montelukast (SINGULAIR) 10 MG tablet TAKE 1 TABLET BY MOUTH EVERY DAY AT NIGHT   Risankizumab-rzaa (SKYRIZI) 150 MG/ML SOSY    No facility-administered encounter medications on file as of 09/11/2023.    Surgical History: Past Surgical History:  Procedure Laterality Date   CHOLECYSTECTOMY      Medical History: Past Medical History:  Diagnosis Date   Depression    Kidney calculi     Family History: Non contributory to the present illness  Social History: Social History   Socioeconomic History   Marital status: Single    Spouse name: Not on file   Number of children: Not on file   Years of education: Not on file   Highest education level: Not on file  Occupational History   Not on file  Tobacco Use   Smoking status: Never   Smokeless tobacco: Not on file  Substance and Sexual Activity   Alcohol use: No   Drug use: No   Sexual activity: Not on file  Other Topics Concern   Not on file  Social History Narrative   Not on file   Social Drivers of Health   Financial Resource Strain: Not on file  Food Insecurity: Not on file  Transportation Needs: Not on file  Physical Activity: Not on file  Stress: Not on file  Social Connections: Not on file  Intimate Partner Violence: Not on file    Vital Signs: Blood pressure (!) 155/113, pulse 95, resp. rate 16, height 5' 10 (1.778 m), weight 225 lb 6.4 oz (  102.2 kg), SpO2 98%. Body mass index is 32.34 kg/m.    Examination: General Appearance: The patient is well-developed, well-nourished, and in no distress. Neck Circumference: 46 cm Skin: Gross inspection of skin unremarkable. Head: normocephalic, no gross deformities. Eyes: no gross deformities noted. ENT: ears appear grossly normal Neurologic: Alert and oriented. No involuntary movements.  STOP BANG RISK ASSESSMENT S (snore) Have you been told that you snore?     NO   T (tired) Are you often tired, fatigued, or sleepy during the day?    NO  O (obstruction) Do you stop breathing, choke, or gasp during sleep? NO   P (pressure) Do you have or are you being treated for high blood pressure? YES   B (BMI) Is your body index greater than 35 kg/m? YES   A (age) Are you 93 years old or older? NO   N (neck) Do you have a neck circumference greater than 16 inches?   YES   G (gender) Are you a male? YES   TOTAL STOP/BANG "YES" ANSWERS 4       A STOP-Bang score of 2 or less is considered low risk, and a score of 5 or more is high risk for having either moderate or severe OSA. For people who score 3 or 4, doctors may need to perform further assessment to determine how likely they are to have OSA.         EPWORTH SLEEPINESS SCALE:  Scale:  (0)= no chance of dozing; (1)= slight chance of dozing; (2)= moderate chance of dozing; (3)= high chance of dozing  Chance  Situtation    Sitting and reading: 0    Watching TV: 0    Sitting Inactive in public: 0    As a passenger in car: 1      Lying down to rest: 1    Sitting and talking: 0    Sitting quielty after lunch: 0    In a car, stopped in traffic: 0   TOTAL SCORE:    2 out of 24    SLEEP STUDIES:  HST (07/2021) AHI 75/hr, min SpO2 79%   CPAP COMPLIANCE DATA:  Date Range: 09/10/2022 - 09/09/2023  Average Daily Use: 7 hours  Median Use: 7 hours   Compliance for > 4 Hours: 89%   AHI: 3.6 respiratory events per hour  Days Used: 364/365  Mask Leak: 16.7  95th Percentile Pressure: 12.5         LABS: No results found for this or any previous visit (from the past 2160 hours).  Radiology: DG Wrist Complete Left Result Date: 02/16/2013 CLINICAL DATA:  Left wrist pain EXAM: LEFT WRIST - COMPLETE 3+ VIEW COMPARISON:  None. FINDINGS: There is no evidence of fracture or dislocation. There is no evidence of arthropathy or other focal bone abnormality. Soft tissues are unremarkable. IMPRESSION: Negative. Electronically Signed   By: Craig Farr M.D.    On: 02/16/2013 23:47    No results found.  No results found.    Assessment and Plan: Patient Active Problem List   Diagnosis Date Noted   GERD (gastroesophageal reflux disease) 07/12/2021   Mild intermittent asthma without complication 07/12/2021      The patient does tolerate PAP and reports benefit from PAP use. The patient was reminded how to adjust mask fit and advised to change supplies regularly. The patient was also counselled on nightly use. The compliance is excellent. The AHI is 3.6. Patient continues to require PAP to treat  their apnea and is medically necessary.  1. OSA (obstructive sleep apnea) (Primary) Continue excellent compliance  2. CPAP use counseling CPAP couseling-Discussed importance of adequate CPAP use as well as proper care and cleaning techniques of machine and all supplies.  3. Essential hypertension Continue current medication and f/u with PCP.  4. Mild intermittent asthma without complication Continue inhalers as prescribed  5. Obesity (BMI 30.0-34.9) Obesity Counseling: Had a lengthy discussion regarding patients BMI and weight issues. Patient was instructed on portion control as well as increased activity. Also discussed caloric restrictions with trying to maintain intake less than 2000 Kcal. Discussions were made in accordance with the 5As of weight management. Simple actions such as not eating late and if able to, taking a walk is suggested.    General Counseling: I have discussed the findings of the evaluation and examination with Alex Bradford.  I have also discussed any further diagnostic evaluation thatmay be needed or ordered today. Alex Bradford verbalizes understanding of the findings of todays visit. We also reviewed his medications today and discussed drug interactions and side effects including but not limited excessive drowsiness and altered mental states. We also discussed that there is always a risk not just to him but also people around him. he has  been encouraged to call the office with any questions or concerns that should arise related to todays visit.  No orders of the defined types were placed in this encounter.       I have personally obtained a history, examined the patient, evaluated laboratory and imaging results, formulated the assessment and plan and placed orders.  This patient was seen by Tinnie Pro, PA-C in collaboration with Dr. Elfreda Bathe as a part of collaborative care agreement.  Elfreda DELENA Bathe, MD [facility] Diplomate ABMS Pulmonary Critical Care Medicine and Sleep Medicine

## 2023-09-11 ENCOUNTER — Ambulatory Visit (INDEPENDENT_AMBULATORY_CARE_PROVIDER_SITE_OTHER): Admitting: Internal Medicine

## 2023-09-11 VITALS — BP 155/113 | HR 95 | Resp 16 | Ht 70.0 in | Wt 225.4 lb

## 2023-09-11 DIAGNOSIS — Z7189 Other specified counseling: Secondary | ICD-10-CM

## 2023-09-11 DIAGNOSIS — E66811 Obesity, class 1: Secondary | ICD-10-CM

## 2023-09-11 DIAGNOSIS — I10 Essential (primary) hypertension: Secondary | ICD-10-CM | POA: Diagnosis not present

## 2023-09-11 DIAGNOSIS — J4520 Mild intermittent asthma, uncomplicated: Secondary | ICD-10-CM

## 2023-09-11 DIAGNOSIS — G4733 Obstructive sleep apnea (adult) (pediatric): Secondary | ICD-10-CM | POA: Diagnosis not present

## 2023-09-11 NOTE — Patient Instructions (Signed)
